# Patient Record
Sex: Male | Born: 2000
Health system: Southern US, Community
[De-identification: ages and names within clinical notes are randomized; demographics above are authoritative.]

## PROBLEM LIST (undated history)

## (undated) DIAGNOSIS — R6250 Unspecified lack of expected normal physiological development in childhood: Secondary | ICD-10-CM

## (undated) DIAGNOSIS — E031 Congenital hypothyroidism without goiter: Secondary | ICD-10-CM

## (undated) DIAGNOSIS — E049 Nontoxic goiter, unspecified: Secondary | ICD-10-CM

## (undated) HISTORY — DX: Unspecified lack of expected normal physiological development in childhood: R62.50

## (undated) HISTORY — DX: Nontoxic goiter, unspecified: E04.9

## (undated) HISTORY — DX: Congenital hypothyroidism without goiter: E03.1

---
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
## 2020-03-02 MED FILL — LEVOTHYROXINE 125 MCG TABLE: 125 | 30 days supply | Qty: 30 | Fill #0

## 2020-05-28 MED FILL — LEVOTHYROXINE 125 MCG TABLE: 125 | 30 days supply | Qty: 30 | Fill #1

## 2020-07-26 ENCOUNTER — Other Ambulatory Visit (INDEPENDENT_AMBULATORY_CARE_PROVIDER_SITE_OTHER): Payer: Self-pay | Admitting: Family

## 2020-07-30 ENCOUNTER — Other Ambulatory Visit (INDEPENDENT_AMBULATORY_CARE_PROVIDER_SITE_OTHER): Payer: Self-pay

## 2020-08-06 ENCOUNTER — Other Ambulatory Visit (INDEPENDENT_AMBULATORY_CARE_PROVIDER_SITE_OTHER): Payer: Self-pay

## 2020-08-06 ENCOUNTER — Other Ambulatory Visit (INDEPENDENT_AMBULATORY_CARE_PROVIDER_SITE_OTHER): Payer: Self-pay | Admitting: Family

## 2020-08-06 MED ORDER — LEVOTHYROXINE SODIUM 125 MCG PO TABS: 125.0000 ug | ORAL_TABLET | Freq: Every day | ORAL | 0 refills | Status: DC

## 2020-08-06 MED FILL — LEVOTHYROXINE 125 MCG TABLE: 125 | 30 days supply | Qty: 30 | Fill #0

## 2020-09-07 ENCOUNTER — Ambulatory Visit (INDEPENDENT_AMBULATORY_CARE_PROVIDER_SITE_OTHER): Payer: Managed Care, Other (non HMO) | Admitting: Family

## 2020-09-07 ENCOUNTER — Encounter (INDEPENDENT_AMBULATORY_CARE_PROVIDER_SITE_OTHER): Payer: Self-pay | Admitting: Family

## 2020-09-07 ENCOUNTER — Other Ambulatory Visit: Payer: Self-pay

## 2020-09-07 VITALS — BP 142/78 | HR 86 | Wt 169.0 lb

## 2020-09-07 DIAGNOSIS — E049 Nontoxic goiter, unspecified: Secondary | ICD-10-CM

## 2020-09-07 DIAGNOSIS — E063 Autoimmune thyroiditis: Secondary | ICD-10-CM | POA: Diagnosis not present

## 2020-09-07 NOTE — Patient Instructions (Signed)
-  Signs of hypothyroidism (underactive thyroid) include increased sleep, sluggishness, weight gain, and constipation. -Signs of hyperthyroidism (overactive thyroid) include difficulty sleeping, diarrhea, heart racing, weight loss, or irritability  Please let me know if you develop any of these symptoms so we can repeat your thyroid tests.  

## 2020-09-07 NOTE — Progress Notes (Signed)
Subjective:   Patient Name: [facility] Date of Birth: Oct 18, 2001  MRN: [id number]  [facility]  presents to the office today for follow-up of his congenital hypothyroidism, acquired hypothyroidism, thyroiditis, goiter, precocity, and growth delay.  HISTORY OF PRESENT ILLNESS:   Jake [facility] is a 19 y.o. Caucasian young man. [facility] was accompanied by his mother.  1. The patient was first referred to me on 10/15/08 by his primary care provider, Dr. [name] of [facility], for evaluation and management of congenital hypothyroidism.  A. His congenital hypothyroidism was diagnosed soon after birth. He began treatment with Synthroid at that point. He was tapered off Synthroid about age 50, but was subsequently restarted on that medication at a dose of 25 mcg per day. Patient's past medical history was essentially unremarkable. Family history was noteworthy for his cousin who had a thyroidectomy for cancer. [Grandmother stated on 10/16/13 that Jake [facility] also has a second or third cousin that has acquired hypothyroidism, without having had thyroid surgery or irradiation.]  B. On physical examination he had a 10-12 gram goiter. Right lobe was within normal limits, but the left lobe was enlarged. His laboratory data showed a TSH of 4.088, free T4 1.43, and free T3 3.8. A thyroid ultrasound performed on 10/20/08 showed a thyroid gland which was slightly enlarged for age, but no nodules were seen. I increased his Synthroid dose at that point to 37.5 mcg per day.  3. The patient's last [facility] visit was on 07/2020 .  In the interim, he has been healthy.  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 He likes to go for walk and runs when he has free time.   He is taking 125 mcg of levothyroxine. Estimates he forgets 1-2 x per week. Denies fatigue, constipation and cold intolerance.   He reports his blood pressure is high  today because he was running late and almost got in a wreck on the way here.   4. Pertinent Review of Systems:  Review of Systems  Constitutional: Negative for malaise/fatigue and weight loss.  HENT: Negative.   Eyes: Negative for blurred vision and photophobia.  Respiratory: Negative for cough and shortness of breath.   Cardiovascular: Negative for chest pain and palpitations.  Gastrointestinal: Negative for abdominal pain, constipation and diarrhea.  Musculoskeletal: Negative for neck pain.  Skin: Negative for itching and rash.  Neurological: Negative for tremors, sensory change, weakness and headaches.  Endo/Heme/Allergies: Negative for polydipsia.  Psychiatric/Behavioral: Negative.      PAST MEDICAL, FAMILY, AND SOCIAL HISTORY  Past Medical History:  Diagnosis Date  . Congenital hypothyroidism   . Goiter   . Physical growth delay     Family History  Problem Relation Age of Onset  . Hypertension Mother      Current Outpatient Medications:  .  levothyroxine (SYNTHROID) 125 MCG tablet, Take 1 tablet (125 mcg total) by mouth daily., Disp: 30 tablet, Rfl: 0 .  adapalene (DIFFERIN) 0.1 % gel, Apply topically at bedtime. (Patient not taking: Reported on 09/07/2020), Disp: , Rfl:  .  Cetirizine HCl (ZYRTEC) 5 MG/5ML SYRP, Take 5 mg by mouth daily. Reported on 03/07/2016 (Patient not taking: Reported on 09/07/2020), Disp: , Rfl:  .  minocycline (MINOCIN) 100 MG capsule, TAKE (1) CAPSULE TWICE DAILY. (Patient not taking: Reported on 09/07/2020), Disp: , Rfl:  .  MINOCYCLINE HCL DT, Place onto teeth. (Patient not taking: Reported on 09/07/2020),  Disp: , Rfl:   Allergies as of 09/07/2020 - Review Complete 09/07/2020  Allergen Reaction Noted  . Doxycycline Nausea And Vomiting 08/22/2012    reports that he is a non-smoker but has been exposed to tobacco smoke. He has never used smokeless tobacco. He reports that he does not drink alcohol and does not use drugs. Pediatric History   Patient Parents  . Talmadge,Jennifer (Mother)   Other Topics Concern  . Not on file  Social History Narrative  . Not on file   1, School and family: he is in [employer] now.  2. Activities: He plays neighborhood sports.     3. Primary Care Provider: Dr. [name] of [facility]  REVIEW OF SYSTEMS: There are no other significant problems involving Jake [facility]' other body systems.   Objective:  Vital Signs:  BP (!) 142/78   Pulse 86   Wt 169 lb (76.7 kg)   BMI 24.03 kg/m    Ht Readings from Last 3 Encounters:  07/28/19 5' 10.32" (1.786 m) (62 %, Z= 0.32)*  09/13/18 5' 10.24" (1.784 m) (64 %, Z= 0.37)*  05/15/18 5' 10.79" (1.798 m) (73 %, Z= 0.61)*   * Growth percentiles are based on CDC (Boys, 2-20 Years) data.   Wt Readings from Last 3 Encounters:  09/07/20 169 lb (76.7 kg) (71 %, Z= 0.56)*  07/28/19 161 lb 9.6 oz (73.3 kg) (68 %, Z= 0.46)*  09/13/18 152 lb 6.4 oz (69.1 kg) (61 %, Z= 0.29)*   * Growth percentiles are based on CDC (Boys, 2-20 Years) data.    Body surface area is 1.95 meters squared.  No height on file for this encounter. 71 %ile (Z= 0.56) based on CDC (Boys, 2-20 Years) weight-for-age data using vitals from 09/07/2020. No head circumference on file for this encounter.   PHYSICAL EXAM:  General: Well developed, well nourished male in no acute distress.   Head: Normocephalic, atraumatic.   Eyes:  Pupils equal and round. EOMI.  Sclera white.  No eye drainage.   Ears/Nose/Mouth/Throat: Nares patent, no nasal drainage.  Normal dentition, mucous membranes moist.  Neck: supple, no cervical lymphadenopathy, + thyromegaly. No tenderness or nodules.  Cardiovascular: regular rate, normal S1/S2, no murmurs Respiratory: No increased work of breathing.  Lungs clear to auscultation bilaterally.  No wheezes. Abdomen: soft, nontender, nondistended. Normal bowel sounds.  No appreciable masses  Extremities: warm, well perfused, cap refill < 2 sec.    Musculoskeletal: Normal muscle mass.  Normal strength Skin: warm, dry.  No rash or lesions. Neurologic: alert and oriented, normal speech, no tremor   LAB DATA:       Assessment and Plan:   ASSESSMENT: Rahm is a 19 y.o. Male with congenital hypothyroidism and goiter. Clinically euthyroid on 125 mcg of levothyroxine, due for labs today.   1. Congential Hypothryoid/Goiter  - Discussed S/s of hypothyroidism  - 125 mcg of levothyroxine per day  - TSH, T4 and T4 ordered    LOS: .>30  spent today reviewing the medical chart, counseling the patient/family, and documenting today's visit.      Gretchen Short,  FNP-C  Pediatric Specialist  [address]  [place], [address]  Tele: [phone]

## 2020-09-08 LAB — T4: T4, Total: 19.4 ug/dL — ABNORMAL HIGH (ref 5.1–10.3)

## 2020-09-08 LAB — T4, FREE: Free T4: 1.7 ng/dL — ABNORMAL HIGH (ref 0.8–1.4)

## 2020-09-08 LAB — TSH: TSH: 4.55 mIU/L — ABNORMAL HIGH (ref 0.50–4.30)

## 2020-11-01 ENCOUNTER — Encounter (INDEPENDENT_AMBULATORY_CARE_PROVIDER_SITE_OTHER): Payer: Self-pay

## 2020-11-02 ENCOUNTER — Other Ambulatory Visit (INDEPENDENT_AMBULATORY_CARE_PROVIDER_SITE_OTHER): Payer: Self-pay

## 2020-11-02 MED ORDER — LEVOTHYROXINE SODIUM 125 MCG PO TABS
125.0000 ug | ORAL_TABLET | Freq: Every day | ORAL | 2 refills | Status: DC
Start: 2020-11-02 — End: 2021-05-26

## 2020-12-08 ENCOUNTER — Ambulatory Visit (INDEPENDENT_AMBULATORY_CARE_PROVIDER_SITE_OTHER): Payer: Managed Care, Other (non HMO) | Admitting: Family

## 2021-01-13 ENCOUNTER — Ambulatory Visit (INDEPENDENT_AMBULATORY_CARE_PROVIDER_SITE_OTHER): Payer: Managed Care, Other (non HMO) | Admitting: Family

## 2021-01-13 NOTE — Progress Notes (Deleted)
Subjective:   Patient Name: Jake Wilson Date of Birth: Dec 21, 2000  MRN: [id number]  Jake Wilson  presents to the office today for follow-up of his congenital hypothyroidism, acquired hypothyroidism, thyroiditis, goiter, precocity, and growth delay.  HISTORY OF PRESENT ILLNESS:   Jake Wilson is a 20 y.o. Caucasian young man. Jake Wilson was accompanied by his mother.  1. The patient was first referred to me on 10/15/08 by his primary care provider, Dr. [name] of [facility], for evaluation and management of congenital hypothyroidism.  A. His congenital hypothyroidism was diagnosed soon after birth. He began treatment with Synthroid at that point. He was tapered off Synthroid about age 18, but was subsequently restarted on that medication at a dose of 25 mcg per day. Patient's past medical history was essentially unremarkable. Family history was noteworthy for his cousin who had a thyroidectomy for cancer. [Grandmother stated on 10/16/13 that Jake Wilson also has a second or third cousin that has acquired hypothyroidism, without having had thyroid surgery or irradiation.]  B. On physical examination he had a 10-12 gram goiter. Right lobe was within normal limits, but the left lobe was enlarged. His laboratory data showed a TSH of 4.088, free T4 1.43, and free T3 3.8. A thyroid ultrasound performed on 10/20/08 showed a thyroid gland which was slightly enlarged for age, but no nodules were seen. I increased his Synthroid dose at that point to 37.5 mcg per day.  3. The patient's last [facility] visit was on 07/2020 .  In the interim, he has been healthy.  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 He likes to go for walk and runs when he has free time.   He is taking 125 mcg of levothyroxine. Estimates he forgets 1-2 x per week. Denies fatigue, constipation and cold intolerance.   He reports his blood pressure is high  today because he was running late and almost got in a wreck on the way here.   4. Pertinent Review of Systems:  Review of Systems  Constitutional: Negative for malaise/fatigue and weight loss.  HENT: Negative.   Eyes: Negative for blurred vision and photophobia.  Respiratory: Negative for cough and shortness of breath.   Cardiovascular: Negative for chest pain and palpitations.  Gastrointestinal: Negative for abdominal pain, constipation and diarrhea.  Musculoskeletal: Negative for neck pain.  Skin: Negative for itching and rash.  Neurological: Negative for tremors, sensory change, weakness and headaches.  Endo/Heme/Allergies: Negative for polydipsia.  Psychiatric/Behavioral: Negative.      PAST MEDICAL, FAMILY, AND SOCIAL HISTORY  Past Medical History:  Diagnosis Date  . Congenital hypothyroidism   . Goiter   . Physical growth delay     Family History  Problem Relation Age of Onset  . Hypertension Mother      Current Outpatient Medications:  .  adapalene (DIFFERIN) 0.1 % gel, Apply topically at bedtime. (Patient not taking: Reported on 09/07/2020), Disp: , Rfl:  .  Cetirizine HCl (ZYRTEC) 5 MG/5ML SYRP, Take 5 mg by mouth daily. Reported on 03/07/2016 (Patient not taking: Reported on 09/07/2020), Disp: , Rfl:  .  levothyroxine (SYNTHROID) 125 MCG tablet, Take 1 tablet (125 mcg total) by mouth daily., Disp: 30 tablet, Rfl: 2 .  minocycline (MINOCIN) 100 MG capsule, TAKE (1) CAPSULE TWICE DAILY. (Patient not taking: Reported on 09/07/2020), Disp: , Rfl:  .  MINOCYCLINE HCL DT, Place onto teeth. (Patient not taking: Reported on 09/07/2020),  Disp: , Rfl:   Allergies as of 01/13/2021 - Review Complete 09/07/2020  Allergen Reaction Noted  . Doxycycline Nausea And Vomiting 08/22/2012    reports that he is a non-smoker but has been exposed to tobacco smoke. He has never used smokeless tobacco. He reports that he does not drink alcohol and does not use drugs. Pediatric History   Patient Parents  . Dovidio,Jennifer (Mother)   Other Topics Concern  . Not on file  Social History Narrative  . Not on file   1, School and family: he is in [employer] now.  2. Activities: He plays neighborhood sports.     3. Primary Care Provider: Dr. [name] of [facility]  REVIEW OF SYSTEMS: There are no other significant problems involving Daichi' other body systems.   Objective:  Vital Signs:  There were no vitals taken for this visit.   Ht Readings from Last 3 Encounters:  07/28/19 5' 10.32" (1.786 m) (62 %, Z= 0.32)*  09/13/18 5' 10.24" (1.784 m) (64 %, Z= 0.37)*  05/15/18 5' 10.79" (1.798 m) (73 %, Z= 0.61)*   * Growth percentiles are based on CDC (Boys, 2-20 Years) data.   Wt Readings from Last 3 Encounters:  09/07/20 169 lb (76.7 kg) (71 %, Z= 0.56)*  07/28/19 161 lb 9.6 oz (73.3 kg) (68 %, Z= 0.46)*  09/13/18 152 lb 6.4 oz (69.1 kg) (61 %, Z= 0.29)*   * Growth percentiles are based on CDC (Boys, 2-20 Years) data.    There is no height or weight on file to calculate BSA.  No height on file for this encounter. No weight on file for this encounter. No head circumference on file for this encounter.   PHYSICAL EXAM:  General: Well developed, well nourished male in no acute distress.   Head: Normocephalic, atraumatic.   Eyes:  Pupils equal and round. EOMI.  Sclera white.  No eye drainage.   Ears/Nose/Mouth/Throat: Nares patent, no nasal drainage.  Normal dentition, mucous membranes moist.  Neck: supple, no cervical lymphadenopathy, no thyromegaly Cardiovascular: regular rate, normal S1/S2, no murmurs Respiratory: No increased work of breathing.  Lungs clear to auscultation bilaterally.  No wheezes. Abdomen: soft, nontender, nondistended. Normal bowel sounds.  No appreciable masses  Extremities: warm, well perfused, cap refill < 2 sec.   Musculoskeletal: Normal muscle mass.  Normal strength Skin: warm, dry.  No rash or  lesions. Neurologic: alert and oriented, normal speech, no tremor   LAB DATA:       Assessment and Plan:   ASSESSMENT: Damyan is a 20 y.o. Male with congenital hypothyroidism and goiter. Clinically euthyroid on 125 mcg of levothyroxine, due for labs today.   1. Congential Hypothryoid/Goiter  - TSH, FT4 and T4 ordered  - Reviewed s/s of hypothyroidism  - Growth chart discussed.  - Answered questions.      LOS: .>30  spent today reviewing the medical chart, counseling the patient/family, and documenting today's visit.      Gretchen Short,  FNP-C  Pediatric Specialist  [address]  [place], [address]  Tele: [phone]

## 2021-02-08 ENCOUNTER — Other Ambulatory Visit: Payer: Self-pay

## 2021-02-08 ENCOUNTER — Encounter (INDEPENDENT_AMBULATORY_CARE_PROVIDER_SITE_OTHER): Payer: Self-pay | Admitting: Family

## 2021-02-08 ENCOUNTER — Ambulatory Visit (INDEPENDENT_AMBULATORY_CARE_PROVIDER_SITE_OTHER): Payer: Managed Care, Other (non HMO) | Admitting: Family

## 2021-02-08 VITALS — BP 120/78 | HR 74 | Ht 70.47 in | Wt 161.2 lb

## 2021-02-08 DIAGNOSIS — E049 Nontoxic goiter, unspecified: Secondary | ICD-10-CM | POA: Diagnosis not present

## 2021-02-08 DIAGNOSIS — E063 Autoimmune thyroiditis: Secondary | ICD-10-CM | POA: Diagnosis not present

## 2021-02-08 NOTE — Progress Notes (Signed)
Subjective:   Patient Name: Jake Wilson Date of Birth: July 11, 2001  MRN: [id number]  Jake Wilson  presents to the office today for follow-up of his congenital hypothyroidism, acquired hypothyroidism, thyroiditis, goiter, precocity, and growth delay.  HISTORY OF PRESENT ILLNESS:   Jake Wilson is a 20 y.o. Caucasian young man. Jake Wilson was accompanied by his mother.  1. The patient was first referred to me on 10/15/08 by his primary care provider, Dr. [name] of [facility], for evaluation and management of congenital hypothyroidism.  A. His congenital hypothyroidism was diagnosed soon after birth. He began treatment with Synthroid at that point. He was tapered off Synthroid about age 47, but was subsequently restarted on that medication at a dose of 25 mcg per day. Patient's past medical history was essentially unremarkable. Family history was noteworthy for his cousin who had a thyroidectomy for cancer. [Grandmother stated on 10/16/13 that Jake Wilson also has a second or third cousin that has acquired hypothyroidism, without having had thyroid surgery or irradiation.]  B. On physical examination he had a 10-12 gram goiter. Right lobe was within normal limits, but the left lobe was enlarged. His laboratory data showed a TSH of 4.088, free T4 1.43, and free T3 3.8. A thyroid ultrasound performed on 10/20/08 showed a thyroid gland which was slightly enlarged for age, but no nodules were seen. I increased his Synthroid dose at that point to 37.5 mcg per day.  3. The patient's last [facility] visit was on 07/2020 .  In the interim, he has been healthy.  He is working at [employer]	[place] and is applying to work at [employer] or [employer]. No longer in school.   He is taking 125 mcg of levothyroxine per day, usually in the morning. He occasionally misses a dose. He denies fatigue, constipation and cold intolerance.    4. Pertinent Review of Systems:  Review of Systems  Constitutional: Negative for  malaise/fatigue and weight loss.  HENT: Negative.   Eyes: Negative for blurred vision and photophobia.  Respiratory: Negative for cough and shortness of breath.   Cardiovascular: Negative for chest pain and palpitations.  Gastrointestinal: Negative for abdominal pain, constipation and diarrhea.  Musculoskeletal: Negative for neck pain.  Skin: Negative for itching and rash.  Neurological: Negative for tremors, sensory change, weakness and headaches.  Endo/Heme/Allergies: Negative for polydipsia.  Psychiatric/Behavioral: Negative.      PAST MEDICAL, FAMILY, AND SOCIAL HISTORY  Past Medical History:  Diagnosis Date  . Congenital hypothyroidism   . Goiter   . Physical growth delay     Family History  Problem Relation Age of Onset  . Hypertension Mother      Current Outpatient Medications:  .  levothyroxine (SYNTHROID) 125 MCG tablet, Take 1 tablet (125 mcg total) by mouth daily., Disp: 30 tablet, Rfl: 2 .  adapalene (DIFFERIN) 0.1 % gel, Apply topically at bedtime. (Patient not taking: No sig reported), Disp: , Rfl:  .  Cetirizine HCl (ZYRTEC) 5 MG/5ML SYRP, Take 5 mg by mouth daily. Reported on 03/07/2016 (Patient not taking: No sig reported), Disp: , Rfl:  .  minocycline (MINOCIN) 100 MG capsule, TAKE (1) CAPSULE TWICE DAILY. (Patient not taking: No sig reported), Disp: , Rfl:  .  MINOCYCLINE HCL DT, Place onto teeth. (Patient not taking: No sig reported), Disp: , Rfl:   Allergies as of 02/08/2021 - Review Complete 02/08/2021  Allergen Reaction Noted  . Doxycycline Nausea And Vomiting 08/22/2012    reports that he is a non-smoker but has been exposed  to tobacco smoke. He has never used smokeless tobacco. He reports that he does not drink alcohol and does not use drugs. Pediatric History  Patient Parents  . Wyble,Jennifer (Mother)   Other Topics Concern  . Not on file  Social History Narrative  . Not on file   1, School and family: he is in [employer] now.  2.  Activities: He plays neighborhood sports.     3. Primary Care Provider: Dr. [name] of [facility]  REVIEW OF SYSTEMS: There are no other significant problems involving Honest' other body systems.   Objective:  Vital Signs:  BP 120/78 (BP Location: Right Arm, Patient Position: Sitting, Cuff Size: Normal)   Pulse 74   Ht 5' 10.47" (1.79 m)   Wt 161 lb 3.2 oz (73.1 kg)   BMI 22.82 kg/m    Ht Readings from Last 3 Encounters:  02/08/21 5' 10.47" (1.79 m) (62 %, Z= 0.31)*  07/28/19 5' 10.32" (1.786 m) (62 %, Z= 0.32)*  09/13/18 5' 10.24" (1.784 m) (64 %, Z= 0.37)*   * Growth percentiles are based on CDC (Boys, 2-20 Years) data.   Wt Readings from Last 3 Encounters:  02/08/21 161 lb 3.2 oz (73.1 kg) (59 %, Z= 0.23)*  09/07/20 169 lb (76.7 kg) (71 %, Z= 0.56)*  07/28/19 161 lb 9.6 oz (73.3 kg) (68 %, Z= 0.46)*   * Growth percentiles are based on CDC (Boys, 2-20 Years) data.    Body surface area is 1.91 meters squared.  62 %ile (Z= 0.31) based on CDC (Boys, 2-20 Years) Stature-for-age data based on Stature recorded on 02/08/2021. 59 %ile (Z= 0.23) based on CDC (Boys, 2-20 Years) weight-for-age data using vitals from 02/08/2021. No head circumference on file for this encounter.   PHYSICAL EXAM:  General: Well developed, well nourished male in no acute distress.   Head: Normocephalic, atraumatic.   Eyes:  Pupils equal and round. EOMI.  Sclera white.  No eye drainage.   Ears/Nose/Mouth/Throat: Nares patent, no nasal drainage.  Normal dentition, mucous membranes moist.  Neck: supple, no cervical lymphadenopathy, + goiter. No nodules or tenderness.  Cardiovascular: regular rate, normal S1/S2, no murmurs Respiratory: No increased work of breathing.  Lungs clear to auscultation bilaterally.  No wheezes. Abdomen: soft, nontender, nondistended. Normal bowel sounds.  No appreciable masses  Extremities: warm, well perfused, cap refill < 2 sec.   Musculoskeletal: Normal muscle  mass.  Normal strength Skin: warm, dry.  No rash or lesions. Neurologic: alert and oriented, normal speech, no tremor   LAB DATA:       Assessment and Plan:   ASSESSMENT: Landon is a 20 y.o. Male with congenital hypothyroidism and goiter. He is clinically euthyroid on 125 mcg of levothyroxine per day. Due for labs today.   1. Congential Hypothryoid/Goiter  - TSH, FT4 and T4 ordered  - Reviewed s/s of hypothyroidism  - Growth chart discussed.  - Answered questions.      LOS:>30  spent today reviewing the medical chart, counseling the patient/family, and documenting today's visit.    Gretchen Short,  FNP-C  Pediatric Specialist  [address]  [place], [address]  Tele: [phone]

## 2021-02-08 NOTE — Patient Instructions (Signed)
Labs today  Continue levothyroxine  6 month follow up

## 2021-02-09 ENCOUNTER — Encounter (INDEPENDENT_AMBULATORY_CARE_PROVIDER_SITE_OTHER): Payer: Self-pay | Admitting: Family

## 2021-02-09 LAB — T4, FREE: Free T4: 1.5 ng/dL — ABNORMAL HIGH (ref 0.8–1.4)

## 2021-02-09 LAB — TSH: TSH: 4.3 mIU/L (ref 0.50–4.30)

## 2021-02-09 LAB — T4: T4, Total: 18.7 ug/dL — ABNORMAL HIGH (ref 5.1–10.3)

## 2021-05-26 ENCOUNTER — Encounter (INDEPENDENT_AMBULATORY_CARE_PROVIDER_SITE_OTHER): Payer: Self-pay

## 2021-05-26 DIAGNOSIS — E063 Autoimmune thyroiditis: Secondary | ICD-10-CM

## 2021-05-26 MED ORDER — LEVOTHYROXINE SODIUM 125 MCG PO TABS: 125.0000 ug | ORAL_TABLET | Freq: Every day | ORAL | 5 refills | Status: DC

## 2021-07-06 ENCOUNTER — Other Ambulatory Visit: Payer: Self-pay

## 2021-07-06 ENCOUNTER — Ambulatory Visit (INDEPENDENT_AMBULATORY_CARE_PROVIDER_SITE_OTHER): Payer: Managed Care, Other (non HMO) | Admitting: Family

## 2021-07-06 ENCOUNTER — Encounter (INDEPENDENT_AMBULATORY_CARE_PROVIDER_SITE_OTHER): Payer: Self-pay | Admitting: Family

## 2021-07-06 VITALS — BP 120/76 | HR 72 | Wt 175.0 lb

## 2021-07-06 DIAGNOSIS — E049 Nontoxic goiter, unspecified: Secondary | ICD-10-CM | POA: Diagnosis not present

## 2021-07-06 DIAGNOSIS — E063 Autoimmune thyroiditis: Secondary | ICD-10-CM

## 2021-07-06 NOTE — Patient Instructions (Signed)
It was a pleasure seeing you in clinic today. Please do not hesitate to contact me if you have questions or concerns.   -Take your medication at the same time every day -Try to take it on an empty stomach -If you forget to take a dose, take it as soon as you remember.  If you don't remember until the next day, take 2 doses then.  NEVER take more than 2 doses at a time. -Use a pill box to help make it easier to keep track of doses   

## 2021-07-06 NOTE — Progress Notes (Signed)
Subjective:   Patient Name: Jake Wilson Date of Birth: 13-Jan-2001  MRN: [id number]  Jake Wilson  presents to the office today for follow-up of his congenital hypothyroidism, acquired hypothyroidism, thyroiditis, goiter, precocity, and growth delay.  HISTORY OF PRESENT ILLNESS:   Jake Wilson is a 20 y.o. Caucasian young man. Jake Wilson was accompanied by his mother.  1. The patient was first referred to me on 10/15/08 by his primary care provider, Dr. [name] of [facility], for evaluation and management of congenital hypothyroidism.  A. His congenital hypothyroidism was diagnosed soon after birth. He began treatment with Synthroid at that point. He was tapered off Synthroid about age 57, but was subsequently restarted on that medication at a dose of 25 mcg per day. Patient's past medical history was essentially unremarkable. Family history was noteworthy for his cousin who had a thyroidectomy for cancer. [Grandmother stated on 10/16/13 that Jake Wilson also has a second or third cousin that has acquired hypothyroidism, without having had thyroid surgery or irradiation.]  B. On physical examination he had a 10-12 gram goiter. Right lobe was within normal limits, but the left lobe was enlarged. His laboratory data showed a TSH of 4.088, free T4 1.43, and free T3 3.8. A thyroid ultrasound performed on 10/20/08 showed a thyroid gland which was slightly enlarged for age, but no nodules were seen. I increased his Synthroid dose at that point to 37.5 mcg per day.  3. The patient's last [facility] visit was on 07/2020 .  In the interim, he has been healthy.  He is currently looking for a new job, he was struggling with his anxiety. He reports his anxiety is "bad" but he is not having panic attacks. Mostly occurs during social situations. He has seen therapy in the past but has not followed up. He is not open to medication at this time.   Taking 125 mcg of levothyroxine per day. Rarely misses a dose, mom  helps remind him. No fatigue, constipation or cold intolerance.     4. Pertinent Review of Systems:  Review of Systems  Constitutional:  Negative for malaise/fatigue and weight loss.  HENT: Negative.    Eyes:  Negative for blurred vision and photophobia.  Respiratory:  Negative for cough and shortness of breath.   Cardiovascular:  Negative for chest pain and palpitations.  Gastrointestinal:  Negative for abdominal pain, constipation and diarrhea.  Musculoskeletal:  Negative for neck pain.  Skin:  Negative for itching and rash.  Neurological:  Negative for tremors, sensory change, weakness and headaches.  Endo/Heme/Allergies:  Negative for polydipsia.  Psychiatric/Behavioral: Negative.      PAST MEDICAL, FAMILY, AND SOCIAL HISTORY  Past Medical History:  Diagnosis Date   Congenital hypothyroidism    Goiter    Physical growth delay     Family History  Problem Relation Age of Onset   Hypertension Mother      Current Outpatient Medications:    levothyroxine (SYNTHROID) 125 MCG tablet, Take 1 tablet (125 mcg total) by mouth daily., Disp: 30 tablet, Rfl: 5   adapalene (DIFFERIN) 0.1 % gel, Apply topically at bedtime. (Patient not taking: No sig reported), Disp: , Rfl:    Cetirizine HCl (ZYRTEC) 5 MG/5ML SYRP, Take 5 mg by mouth daily. Reported on 03/07/2016 (Patient not taking: No sig reported), Disp: , Rfl:    minocycline (MINOCIN) 100 MG capsule, TAKE (1) CAPSULE TWICE DAILY. (Patient not taking: No sig reported), Disp: , Rfl:    MINOCYCLINE HCL DT, Place onto teeth. (Patient not  taking: No sig reported), Disp: , Rfl:   Allergies as of 07/06/2021 - Review Complete 07/06/2021  Allergen Reaction Noted   Doxycycline Nausea And Vomiting 08/22/2012    reports that he has never smoked. He has been exposed to tobacco smoke. He has never used smokeless tobacco. He reports that he does not drink alcohol and does not use drugs. Pediatric History  Patient Parents   Banka,Jennifer  (Mother)   Other Topics Concern   Not on file  Social History Narrative   Not on file   1, School and family: he is in [employer] now.  2. Activities: He plays neighborhood sports.     3. Primary Care Provider: Dr. [name] of [facility]  REVIEW OF SYSTEMS: There are no other significant problems involving Jake Wilson' other body systems.   Objective:  Vital Signs:  BP 120/76 (BP Location: Right Arm, Patient Position: Sitting, Cuff Size: Normal)   Pulse 72   Wt 175 lb (79.4 kg)   BMI 24.77 kg/m    Ht Readings from Last 3 Encounters:  02/08/21 5' 10.47" (1.79 m) (62 %, Z= 0.31)*  07/28/19 5' 10.32" (1.786 m) (62 %, Z= 0.32)*  09/13/18 5' 10.24" (1.784 m) (64 %, Z= 0.37)*   * Growth percentiles are based on CDC (Boys, 2-20 Years) data.   Wt Readings from Last 3 Encounters:  07/06/21 175 lb (79.4 kg)  02/08/21 161 lb 3.2 oz (73.1 kg) (59 %, Z= 0.23)*  09/07/20 169 lb (76.7 kg) (71 %, Z= 0.56)*   * Growth percentiles are based on CDC (Boys, 2-20 Years) data.    Body surface area is 1.99 meters squared.  Facility age limit for growth percentiles is 20 years. Facility age limit for growth percentiles is 20 years. Facility age limit for growth percentiles is 20 years.   PHYSICAL EXAM: General: Well developed, well nourished male in no acute distress.   Head: Normocephalic, atraumatic.   Eyes:  Pupils equal and round. EOMI.  Sclera white.  No eye drainage.   Ears/Nose/Mouth/Throat: Nares patent, no nasal drainage.  Normal dentition, mucous membranes moist.  Neck: supple, no cervical lymphadenopathy, + thyromegaly. No nodules or tenderness.  Cardiovascular: regular rate, normal S1/S2, no murmurs Respiratory: No increased work of breathing.  Lungs clear to auscultation bilaterally.  No wheezes. Abdomen: soft, nontender, nondistended. Normal bowel sounds.  No appreciable masses  Extremities: warm, well perfused, cap refill < 2 sec.   Musculoskeletal: Normal  muscle mass.  Normal strength Skin: warm, dry.  No rash or lesions. Neurologic: alert and oriented, normal speech, no tremor    LAB DATA:       Assessment and Plan:   ASSESSMENT: Jake Wilson is a 20 y.o. Male with congenital hypothyroidism and goiter. Taking 125 mcg of levothyroxine per day. Clinically euthyroid.     1. Congential Hypothryoid/Goiter  - Reviewed growth chart.  - Discussed s/s of hypothyroidism  - Take 125 mcg of levothyroxine per day  - TSh, FT4 and T4 ordered.    Gretchen Short,  FNP-C  Pediatric Specialist  [address]  [place], [address]  Tele: [phone]   >30 spent today reviewing the medical chart, counseling the patient/family, and documenting today's visit.

## 2021-07-07 ENCOUNTER — Encounter (INDEPENDENT_AMBULATORY_CARE_PROVIDER_SITE_OTHER): Payer: Self-pay

## 2021-07-10 ENCOUNTER — Encounter (INDEPENDENT_AMBULATORY_CARE_PROVIDER_SITE_OTHER): Payer: Self-pay

## 2021-07-13 LAB — TEST AUTHORIZATION

## 2021-07-13 LAB — T4, FREE: Free T4: 1.7 ng/dL — ABNORMAL HIGH (ref 0.8–1.4)

## 2021-07-13 LAB — T4: T4, Total: 32.5 ug/dL — ABNORMAL HIGH (ref 5.1–10.3)

## 2021-07-13 LAB — DIRECT DIALYSIS FREE T4: T4, Free Direct Dialysis: 2 ng/dL (ref 0.9–2.2)

## 2021-07-13 LAB — TSH: TSH: 4.99 mIU/L — ABNORMAL HIGH (ref 0.40–4.50)

## 2022-01-03 ENCOUNTER — Encounter (INDEPENDENT_AMBULATORY_CARE_PROVIDER_SITE_OTHER): Payer: Self-pay | Admitting: Family

## 2022-01-03 ENCOUNTER — Ambulatory Visit (INDEPENDENT_AMBULATORY_CARE_PROVIDER_SITE_OTHER): Payer: Managed Care, Other (non HMO) | Admitting: Family

## 2022-01-03 ENCOUNTER — Other Ambulatory Visit: Payer: Self-pay

## 2022-01-03 ENCOUNTER — Encounter (INDEPENDENT_AMBULATORY_CARE_PROVIDER_SITE_OTHER): Payer: Self-pay

## 2022-01-03 DIAGNOSIS — E063 Autoimmune thyroiditis: Secondary | ICD-10-CM

## 2022-01-03 NOTE — Progress Notes (Signed)
-   Jake Wilson left appointment because he did not have insurance and was anxious about copay. Our office contacted him to discuss and reschedule.

## 2022-02-15 ENCOUNTER — Ambulatory Visit (INDEPENDENT_AMBULATORY_CARE_PROVIDER_SITE_OTHER): Payer: Self-pay | Admitting: Family

## 2022-02-15 ENCOUNTER — Encounter (INDEPENDENT_AMBULATORY_CARE_PROVIDER_SITE_OTHER): Payer: Self-pay | Admitting: Family

## 2022-02-15 DIAGNOSIS — E063 Autoimmune thyroiditis: Secondary | ICD-10-CM

## 2022-02-15 MED ORDER — LEVOTHYROXINE SODIUM 125 MCG PO TABS: 125.0000 ug | ORAL_TABLET | Freq: Every day | ORAL | 5 refills | Status: AC

## 2022-02-15 NOTE — Progress Notes (Signed)
Subjective:  ? ?Patient Name: Jake Wilson Date of Birth: 07-08-2001  MRN: [id number] ? ?Corrigan Kretschmer  presents to the office today for follow-up of his congenital hypothyroidism, acquired hypothyroidism, thyroiditis, goiter, precocity, and growth delay. ? ?HISTORY OF PRESENT ILLNESS:  ? ?Jake Wilson is a 21 y.o. Caucasian young man. Jake Wilson was accompanied by his mother. ? ?1. The patient was first referred to me on 10/15/08 by his primary care provider, Dr. Gorden Wilson of [facility], for evaluation and management of congenital hypothyroidism. ? A. His congenital hypothyroidism was diagnosed soon after birth. He began treatment with Synthroid at that point. He was tapered off Synthroid about age 13, but was subsequently restarted on that medication at a dose of 25 mcg per day. Patient's past medical history was essentially unremarkable. Family history was noteworthy for his cousin who had a thyroidectomy for cancer. [Grandmother stated on 10/16/13 that Jake Wilson also has a second or third cousin that has acquired hypothyroidism, without having had thyroid surgery or irradiation.] ? B. On physical examination he had a 10-12 gram goiter. Right lobe was within normal limits, but the left lobe was enlarged. His laboratory data showed a TSH of 4.088, free T4 1.43, and free T3 3.8. A thyroid ultrasound performed on 10/20/08 showed a thyroid gland which was slightly enlarged for age, but no nodules were seen. I increased his Synthroid dose at that point to 37.5 mcg per day. ? ?3. The patient's last [facility] visit was on 07/2020 .  In the interim, he has been healthy. ? ?He is currently without insurance, his job offers it for full time employees but he is not full time. Works at [employer]. He ran out of levothyroxine about 1 month ago, prior to that he was taking it consistently. He does have fatigue, denies constipation and cold intolerance.  ? ? ?4. Pertinent Review of Systems:  ?Review of Systems   ?Constitutional:  Negative for malaise/fatigue and weight loss.  ?HENT: Negative.    ?Eyes:  Negative for blurred vision and photophobia.  ?Respiratory:  Negative for cough and shortness of breath.   ?Cardiovascular:  Negative for chest pain and palpitations.  ?Gastrointestinal:  Negative for abdominal pain, constipation and diarrhea.  ?Musculoskeletal:  Negative for neck pain.  ?Skin:  Negative for itching and rash.  ?Neurological:  Negative for tremors, sensory change, weakness and headaches.  ?Endo/Heme/Allergies:  Negative for polydipsia.  ?Psychiatric/Behavioral: Negative.    ? ? ?PAST MEDICAL, FAMILY, AND SOCIAL HISTORY ? ?Past Medical History:  ?Diagnosis Date  ? Congenital hypothyroidism   ? Goiter   ? Physical growth delay   ? ? ?Family History  ?Problem Relation Age of Onset  ? Hypertension Mother   ? ? ? ?Current Outpatient Medications:  ?  adapalene (DIFFERIN) 0.1 % gel, Apply topically at bedtime. (Patient not taking: Reported on 09/07/2020), Disp: , Rfl:  ?  Cetirizine HCl (ZYRTEC) 5 MG/5ML SYRP, Take 5 mg by mouth daily. Reported on 03/07/2016 (Patient not taking: Reported on 09/07/2020), Disp: , Rfl:  ?  levothyroxine (SYNTHROID) 125 MCG tablet, Take 1 tablet (125 mcg total) by mouth daily., Disp: 30 tablet, Rfl: 5 ?  minocycline (MINOCIN) 100 MG capsule, TAKE (1) CAPSULE TWICE DAILY. (Patient not taking: No sig reported), Disp: , Rfl:  ?  MINOCYCLINE HCL DT, Place onto teeth. (Patient not taking: Reported on 09/07/2020), Disp: , Rfl:  ? ?Allergies as of 02/15/2022 - Review Complete 02/15/2022  ?Allergen Reaction Noted  ? Doxycycline Nausea And Vomiting  08/22/2012  ? ? reports that he has never smoked. He has been exposed to tobacco smoke. He has never used smokeless tobacco. He reports that he does not drink alcohol and does not use drugs. ?Pediatric History  ?Patient Parents  ? Wilson,Jake (Mother)  ? ?Other Topics Concern  ? Not on file  ?Social History Narrative  ? Not on file  ? ?1, School and  family: he is in [employer] now.  ?2. Activities: He plays neighborhood sports.     ?3. Primary Care Provider: Dr. Gorden Wilson of [facility] ? ?REVIEW OF SYSTEMS: There are no other significant problems involving Nyan' other body systems. ? ? Objective:  ?Vital Signs: ? ?BP 112/68   Pulse 66   Wt 172 lb 6.4 oz (78.2 kg)   BMI 24.41 kg/m?  ?  ?Ht Readings from Last 3 Encounters:  ?02/08/21 5' 10.47" (1.79 m) (62 %, Z= 0.31)*  ?07/28/19 5' 10.32" (1.786 m) (62 %, Z= 0.32)*  ?09/13/18 5' 10.24" (1.784 m) (64 %, Z= 0.37)*  ? ?* Growth percentiles are based on CDC (Boys, 2-20 Years) data.  ? ?Wt Readings from Last 3 Encounters:  ?02/15/22 172 lb 6.4 oz (78.2 kg)  ?07/06/21 175 lb (79.4 kg)  ?02/08/21 161 lb 3.2 oz (73.1 kg) (59 %, Z= 0.23)*  ? ?* Growth percentiles are based on CDC (Boys, 2-20 Years) data.  ? ? ?Body surface area is 1.97 meters squared. ? ?Facility age limit for growth percentiles is 20 years. ?Facility age limit for growth percentiles is 20 years. ?Facility age limit for growth percentiles is 20 years. ? ? ?PHYSICAL EXAM: ?General: Well developed, well nourished male in no acute distress.   ?Head: Normocephalic, atraumatic.   ?Eyes:  Pupils equal and round. EOMI.  Sclera white.  No eye drainage.   ?Ears/Nose/Mouth/Throat: Nares patent, no nasal drainage.  Normal dentition, mucous membranes moist.  ?Neck: supple, no cervical lymphadenopathy, no thyromegaly ?Cardiovascular: regular rate, normal S1/S2, no murmurs ?Respiratory: No increased work of breathing.  Lungs clear to auscultation bilaterally.  No wheezes. ?Abdomen: soft, nontender, nondistended. Normal bowel sounds.  No appreciable masses  ?Extremities: warm, well perfused, cap refill < 2 sec.   ?Musculoskeletal: Normal muscle mass.  Normal strength ?Skin: warm, dry.  No rash or lesions. ?Neurologic: alert and oriented, normal speech, no tremor ? ? ? ? ?LAB DATA:  ? ? ? ? ? Assessment and Plan:  ? ?ASSESSMENT: Luvern is a 21  y.o. Male with congenital hypothyroidism. He has not been taking levothyroxine due to issues with insurance/finances and is clinically hypothyroid. Will restart medicine today.  ? ?1. Congential Hypothryoid/Goiter  ?- Restart 125 mcg of levothyroxine, script sent.  ?- TSH, FT4 and T4 ordered, will be drawn after he has been on levothyroxine for 2-3 months.  ?- Discussed s/s of hypothyroidism and when to contact clinic.  ?- Encouraged him to establish with adult primary care provider.  ? ? ?Gretchen Short,  FNP-C  ?Pediatric Specialist  ?[address]  ?[place], [address]  ?Tele: [phone] ? ? ?LOS: >30  spent today reviewing the medical chart, counseling the patient/family, and documenting today's visit.  ? ? ? ? ? ? ? ? ?

## 2022-02-15 NOTE — Patient Instructions (Addendum)
-   Restart 125 mcg of levothyroxine per day - Cost should be 4-10 dollars.  ?- In 3 months after being on medicine, get labs done  ?- Follow up in 6 months.  ?- [facility] for primary care or [facility] ?

## 2022-08-17 ENCOUNTER — Encounter (INDEPENDENT_AMBULATORY_CARE_PROVIDER_SITE_OTHER): Payer: Self-pay | Admitting: Family

## 2022-08-17 ENCOUNTER — Telehealth (INDEPENDENT_AMBULATORY_CARE_PROVIDER_SITE_OTHER): Payer: Self-pay | Admitting: Family

## 2022-08-17 ENCOUNTER — Ambulatory Visit (INDEPENDENT_AMBULATORY_CARE_PROVIDER_SITE_OTHER): Payer: Self-pay | Admitting: Family

## 2022-08-17 VITALS — BP 110/70 | HR 64 | Wt 177.2 lb

## 2022-08-17 DIAGNOSIS — E030 Congenital hypothyroidism with diffuse goiter: Secondary | ICD-10-CM

## 2022-08-17 DIAGNOSIS — E031 Congenital hypothyroidism without goiter: Secondary | ICD-10-CM

## 2022-08-17 NOTE — Patient Instructions (Signed)

## 2022-08-17 NOTE — Progress Notes (Signed)
Subjective:   Patient Name: Jake Wilson Date of Birth: August 20, 2001  MRN: [id number]  Jake Wilson  presents to the office today for follow-up of his congenital hypothyroidism, thyroiditis, goiter, precocity, and growth delay.  HISTORY OF PRESENT ILLNESS:   Jake Wilson is a 21 y.o. Caucasian young man. Jake Wilson was accompanied by his mother.  1. The patient was first referred to me on 10/15/08 by his primary care provider, Dr. Robyne Askew of [facility], for evaluation and management of congenital hypothyroidism.  A. His congenital hypothyroidism was diagnosed soon after birth. He began treatment with Synthroid at that point. He was tapered off Synthroid about age 35, but was subsequently restarted on that medication at a dose of 25 mcg per day. Patient's past medical history was essentially unremarkable. Family history was noteworthy for his cousin who had a thyroidectomy for cancer. [Grandmother stated on 10/16/13 that Jake Wilson also has a second or third cousin that has acquired hypothyroidism, without having had thyroid surgery or irradiation.]  B. On physical examination he had a 10-12 gram goiter. Right lobe was within normal limits, but the left lobe was enlarged. His laboratory data showed a TSH of 4.088, free T4 1.43, and free T3 3.8. A thyroid ultrasound performed on 10/20/08 showed a thyroid gland which was slightly enlarged for age, but no nodules were seen. I increased his Synthroid dose at that point to 37.5 mcg per day.  3. The patient's last [facility] visit was on 02/2022 .  In the interim, he has been healthy.  Working at [employer], work is going well. He reports skipping levothyroxine about 2 days per week usually because he gets distracted. Denies fatigue, constipation and constipation.    4. Pertinent Review of Systems:  Review of Systems  Constitutional:  Negative for malaise/fatigue and weight loss.  HENT: Negative.    Eyes:  Negative for blurred vision and photophobia.   Respiratory:  Negative for cough and shortness of breath.   Cardiovascular:  Negative for chest pain and palpitations.  Gastrointestinal:  Negative for abdominal pain, constipation and diarrhea.  Musculoskeletal:  Negative for neck pain.  Skin:  Negative for itching and rash.  Neurological:  Negative for tremors, sensory change, weakness and headaches.  Endo/Heme/Allergies:  Negative for polydipsia.  Psychiatric/Behavioral: Negative.       PAST MEDICAL, FAMILY, AND SOCIAL HISTORY  Past Medical History:  Diagnosis Date   Congenital hypothyroidism    Goiter    Physical growth delay     Family History  Problem Relation Age of Onset   Hypertension Mother      Current Outpatient Medications:    levothyroxine (SYNTHROID) 125 MCG tablet, Take 1 tablet (125 mcg total) by mouth daily., Disp: 30 tablet, Rfl: 5   adapalene (DIFFERIN) 0.1 % gel, Apply topically at bedtime. (Patient not taking: Reported on 09/07/2020), Disp: , Rfl:    Cetirizine HCl (ZYRTEC) 5 MG/5ML SYRP, Take 5 mg by mouth daily. Reported on 03/07/2016 (Patient not taking: Reported on 09/07/2020), Disp: , Rfl:    minocycline (MINOCIN) 100 MG capsule, TAKE (1) CAPSULE TWICE DAILY. (Patient not taking: No sig reported), Disp: , Rfl:    MINOCYCLINE HCL DT, Place onto teeth. (Patient not taking: Reported on 09/07/2020), Disp: , Rfl:   Allergies as of 08/17/2022 - Review Complete 08/17/2022  Allergen Reaction Noted   Doxycycline Nausea And Vomiting 08/22/2012    reports that he has never smoked. He has been exposed to tobacco smoke. He has never used smokeless tobacco. He reports  that he does not drink alcohol and does not use drugs. Pediatric History  Patient Parents   Chartered certified accountant (Mother)   Other Topics Concern   Not on file  Social History Narrative   Not on file    Primary Care Provider: Dr. [name] of [facility]  REVIEW OF SYSTEMS: There are no other significant problems involving Jake Wilson' other  body systems.   Objective:  Vital Signs:  BP 110/70   Pulse 64   Wt 177 lb 3.2 oz (80.4 kg)   BMI 25.09 kg/m    Ht Readings from Last 3 Encounters:  02/08/21 5' 10.47" (1.79 m) (62 %, Z= 0.31)*  07/28/19 5' 10.32" (1.786 m) (62 %, Z= 0.32)*  09/13/18 5' 10.24" (1.784 m) (64 %, Z= 0.37)*   * Growth percentiles are based on CDC (Boys, 2-20 Years) data.   Wt Readings from Last 3 Encounters:  08/17/22 177 lb 3.2 oz (80.4 kg)  02/15/22 172 lb 6.4 oz (78.2 kg)  07/06/21 175 lb (79.4 kg)    Body surface area is 2 meters squared.  Facility age limit for growth %iles is 20 years. Facility age limit for growth %iles is 20 years. Facility age limit for growth %iles is 20 years.   PHYSICAL EXAM: General: Well developed, well nourished male in no acute distress.  Head: Normocephalic, atraumatic.   Eyes:  Pupils equal and round. EOMI.  Sclera white.  No eye drainage.   Ears/Nose/Mouth/Throat: Nares patent, no nasal drainage.  Normal dentition, mucous membranes moist.  Neck: supple, no cervical lymphadenopathy, no thyromegaly Cardiovascular: regular rate, normal S1/S2, no murmurs Respiratory: No increased work of breathing.  Lungs clear to auscultation bilaterally.  No wheezes. Abdomen: soft, nontender, nondistended. Normal bowel sounds.  No appreciable masses  Extremities: warm, well perfused, cap refill < 2 sec.   Musculoskeletal: Normal muscle mass.  Normal strength Skin: warm, dry.  No rash or lesions. Neurologic: alert and oriented, normal speech, no tremor    LAB DATA:     Assessment and Plan:   ASSESSMENT: Jake Wilson is a 21 y.o. Male with congenital hypothyroidism. Inconsistent with levothyroxine but clinically euthyroid.   1. Congential Hypothryoid/Goiter  - FT4, T4 and TSH ordered  - 125 mcg of levothyroxine per day  - Encouraged to take levothyroxine at the same time every morning.     Jake Short,  FNP-C  Pediatric Specialist  [address]   [place], [address]  Tele: [phone]   LOS: >20  spent today reviewing the medical chart, counseling the patient/family, and documenting today's visit.

## 2022-08-17 NOTE — Telephone Encounter (Signed)
  Name of who is calling: Syed Zukas contact number: [phone]  Provider they see: Hermenia Bers  Reason for call: Patient called and stated he wasn't able to get his lab work done today because he didn't have the money to due so.

## 2023-01-04 ENCOUNTER — Other Ambulatory Visit: Payer: Self-pay

## 2023-02-19 ENCOUNTER — Ambulatory Visit (INDEPENDENT_AMBULATORY_CARE_PROVIDER_SITE_OTHER): Payer: Self-pay | Admitting: Family

## 2023-03-20 ENCOUNTER — Ambulatory Visit (INDEPENDENT_AMBULATORY_CARE_PROVIDER_SITE_OTHER): Payer: Medicaid Other | Admitting: Family

## 2023-03-28 ENCOUNTER — Telehealth (INDEPENDENT_AMBULATORY_CARE_PROVIDER_SITE_OTHER): Payer: Self-pay | Admitting: Family

## 2023-03-28 NOTE — Telephone Encounter (Signed)
Who's calling (name and relationship to patient) :Victorino Dike- Mom   Best contact number:[phone]  Provider they see: Dalbert Garnet  Reason for call: Mom called in stating that Demontrez is waiting to see his new adult endocrinology set September 9th but is needing a prescription refill until then. I did inform mom that there is no DPR on file so we can not speak with her, the number provided is Leonette Most number.    Call ID:      PRESCRIPTION REFILL ONLY  Name of prescription:Levothyroxine (SYNTHROID) 125 MCG tablet   Pharmacy:[facility]- [facility] [place]- [address]

## 2024-02-12 ENCOUNTER — Encounter (INDEPENDENT_AMBULATORY_CARE_PROVIDER_SITE_OTHER): Payer: Self-pay

## 2024-02-25 ENCOUNTER — Encounter (INDEPENDENT_AMBULATORY_CARE_PROVIDER_SITE_OTHER): Payer: Self-pay
